# Patient Record
Sex: Male | Born: 1995 | Race: Black or African American | Hispanic: No | Marital: Single | Smoking: Never smoker
Health system: Southern US, Community
[De-identification: ages and names within clinical notes are randomized; demographics above are authoritative.]

---
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
## 2015-02-06 ENCOUNTER — Emergency Department
Admission: EM | Admit: 2015-02-06 | Discharge: 2015-02-06 | Disposition: A | Discharge status: Home / Self Care | Payer: PRIVATE HEALTH INSURANCE | Attending: Emergency Medicine | Admitting: Emergency Medicine

## 2015-02-06 ENCOUNTER — Encounter: Payer: Self-pay | Admitting: Emergency Medicine

## 2015-02-06 ENCOUNTER — Emergency Department: Payer: PRIVATE HEALTH INSURANCE

## 2015-02-06 DIAGNOSIS — S0990XA Unspecified injury of head, initial encounter: Secondary | ICD-10-CM | POA: Diagnosis not present

## 2015-02-06 DIAGNOSIS — Z880 Allergy status to penicillin: Secondary | ICD-10-CM | POA: Insufficient documentation

## 2015-02-06 DIAGNOSIS — I951 Orthostatic hypotension: Secondary | ICD-10-CM | POA: Diagnosis not present

## 2015-02-06 DIAGNOSIS — Y998 Other external cause status: Secondary | ICD-10-CM | POA: Diagnosis not present

## 2015-02-06 DIAGNOSIS — T1490XA Injury, unspecified, initial encounter: Secondary | ICD-10-CM

## 2015-02-06 DIAGNOSIS — W01198A Fall on same level from slipping, tripping and stumbling with subsequent striking against other object, initial encounter: Secondary | ICD-10-CM | POA: Insufficient documentation

## 2015-02-06 DIAGNOSIS — R55 Syncope and collapse: Secondary | ICD-10-CM | POA: Diagnosis present

## 2015-02-06 DIAGNOSIS — Y9289 Other specified places as the place of occurrence of the external cause: Secondary | ICD-10-CM | POA: Insufficient documentation

## 2015-02-06 DIAGNOSIS — Y9389 Activity, other specified: Secondary | ICD-10-CM | POA: Diagnosis not present

## 2015-02-06 LAB — URINE DRUG SCREEN, QUALITATIVE (ARMC ONLY)
Amphetamines, Ur Screen: NOT DETECTED
Barbiturates, Ur Screen: NOT DETECTED
Benzodiazepine, Ur Scrn: NOT DETECTED
CANNABINOID 50 NG, UR ~~LOC~~: NOT DETECTED
COCAINE METABOLITE, UR ~~LOC~~: NOT DETECTED
MDMA (ECSTASY) UR SCREEN: NOT DETECTED
Methadone Scn, Ur: NOT DETECTED
OPIATE, UR SCREEN: NOT DETECTED
PHENCYCLIDINE (PCP) UR S: NOT DETECTED
Tricyclic, Ur Screen: NOT DETECTED

## 2015-02-06 LAB — CBC WITH DIFFERENTIAL/PLATELET
Basophils Absolute: 0 10*3/uL (ref 0–0.1)
Basophils Relative: 1 %
EOS ABS: 0.1 10*3/uL (ref 0–0.7)
Eosinophils Relative: 2 %
HCT: 45.1 % (ref 40.0–52.0)
HEMOGLOBIN: 14.8 g/dL (ref 13.0–18.0)
LYMPHS ABS: 2.1 10*3/uL (ref 1.0–3.6)
LYMPHS PCT: 42 %
MCH: 28.1 pg (ref 26.0–34.0)
MCHC: 32.7 g/dL (ref 32.0–36.0)
MCV: 85.7 fL (ref 80.0–100.0)
Monocytes Absolute: 0.3 10*3/uL (ref 0.2–1.0)
Monocytes Relative: 7 %
NEUTROS PCT: 48 %
Neutro Abs: 2.4 10*3/uL (ref 1.4–6.5)
Platelets: 282 10*3/uL (ref 150–440)
RBC: 5.26 MIL/uL (ref 4.40–5.90)
RDW: 14.2 % (ref 11.5–14.5)
WBC: 5 10*3/uL (ref 3.8–10.6)

## 2015-02-06 LAB — COMPREHENSIVE METABOLIC PANEL
ALK PHOS: 83 U/L (ref 38–126)
ALT: 16 U/L — AB (ref 17–63)
AST: 17 U/L (ref 15–41)
Albumin: 4 g/dL (ref 3.5–5.0)
Anion gap: 3 — ABNORMAL LOW (ref 5–15)
BUN: 15 mg/dL (ref 6–20)
CALCIUM: 9.1 mg/dL (ref 8.9–10.3)
CO2: 28 mmol/L (ref 22–32)
CREATININE: 1.1 mg/dL (ref 0.61–1.24)
Chloride: 106 mmol/L (ref 101–111)
GFR calc non Af Amer: >60 mL/min (ref >60)
Glucose, Bld: 103 mg/dL — ABNORMAL HIGH (ref 65–99)
Potassium: 4 mmol/L (ref 3.5–5.1)
SODIUM: 137 mmol/L (ref 135–145)
Total Bilirubin: 0.5 mg/dL (ref 0.3–1.2)
Total Protein: 7.3 g/dL (ref 6.5–8.1)

## 2015-02-06 LAB — URINALYSIS COMPLETE WITH MICROSCOPIC (ARMC ONLY)
BACTERIA UA: NONE SEEN
BILIRUBIN URINE: NEGATIVE
Glucose, UA: NEGATIVE mg/dL
Hgb urine dipstick: NEGATIVE
KETONES UR: NEGATIVE mg/dL
Leukocytes, UA: NEGATIVE
Nitrite: NEGATIVE
PROTEIN: NEGATIVE mg/dL
SPECIFIC GRAVITY, URINE: 1.02 (ref 1.005–1.030)
WBC UA: NONE SEEN WBC/hpf (ref 0–5)
pH: 7 (ref 5.0–8.0)

## 2015-02-06 LAB — TROPONIN I

## 2015-02-06 LAB — ETHANOL: Alcohol, Ethyl (B): 14 mg/dL — ABNORMAL HIGH (ref <5)

## 2015-02-06 MED ORDER — SODIUM CHLORIDE 0.9 % IV BOLUS (SEPSIS)
1000.0000 mL | Freq: Once | INTRAVENOUS | Status: AC
  Administered 2015-02-06: 1000 mL via INTRAVENOUS

## 2015-02-06 NOTE — ED Provider Notes (Signed)
[facility] Emergency Department Provider Note  Time seen: 7:17 AM  I have reviewed the triage vital signs and the nursing notes.   HISTORY  Chief Complaint Fall and Head Injury    HPI Bethann Gooaequan U Jean is a 19 y.o. male with no past medical history who presents the emergency department after a syncopal episode. According to the patient he was lying on the couch, when he abruptly stood up to go to the bathroom. Said he felt lightheaded, so he stopped for a second and then continued walking, and then woke up on the floor. Patient states he remembers hitting his head, and he chipped one of his teeth. Upon arrival to the emergency department the patient was tearful, stating that he did not feel normal. Patient denies any recent black or bloody stool, nausea, vomiting, diarrhea, fever. States he's not been drinking as much or eating right due to college exams. States he has passed out once previously. Denies any chest pain or shortness of breath now or at any time.     History reviewed. No pertinent past medical history.  There are no active problems to display for this patient.   History reviewed. No pertinent past surgical history.  No current outpatient prescriptions on file.  Allergies Penicillins  No family history on file.  Social History Social History  Substance Use Topics  . Smoking status: Never Smoker   . Smokeless tobacco: None  . Alcohol Use: No    Review of Systems Constitutional: Negative for fever. Cardiovascular: Negative for chest pain. Respiratory: Negative for shortness of breath. Gastrointestinal: Negative for abdominal pain, vomiting and diarrhea. Musculoskeletal: Negative for back pain. Negative neck pain. Neurological: Negative for headaches, focal weakness or numbness. 10-point ROS otherwise negative.  ____________________________________________   PHYSICAL EXAM:  VITAL SIGNS: ED Triage Vitals  Enc Vitals Group   BP 02/06/15 0323 136/63 mmHg     Pulse Rate 02/06/15 0323 62     Resp 02/06/15 0323 18     Temp 02/06/15 0323 98 F (36.7 C)     Temp Source 02/06/15 0323 Oral     SpO2 02/06/15 0323 97 %     Weight 02/06/15 0323 184 lb (83.462 kg)     Height 02/06/15 0323 6\' 3"  (1.905 m)     Head Cir --      Peak Flow --      Pain Score 02/06/15 0322 8     Pain Loc --      Pain Edu? --      Excl. in GC? --     Constitutional: Alert and oriented. Well appearing and in no distress. Eyes: Normal exam ENT   Head: Normocephalic and atraumatic.   Mouth/Throat: Mucous membranes are moist. Cardiovascular: Normal rate, regular rhythm. No murmur Respiratory: Normal respiratory effort without tachypnea nor retractions. Breath sounds are clear and equal bilaterally. No wheezes/rales/rhonchi. Gastrointestinal: Soft and nontender. No distention.  Musculoskeletal: Nontender with normal range of motion in all extremities.  Neurologic:  Normal speech and language. No gross focal neurologic deficits. Equal grip strengths. No pronator drift.  Skin:  Skin is warm, dry and intact.  Psychiatric: Mood and affect are normal. Speech and behavior are normal.  ____________________________________________    EKG  EKG reviewed and interpreted by myself shows normal sinus rhythm at 54 bpm, narrow QRS, normal axis, normal intervals, changes most consistent with early repolarization. No concerning ST changes noted.  ____________________________________________    RADIOLOGY  Knee x-ray within normal  limits CT head negative  ____________________________________________   INITIAL IMPRESSION / ASSESSMENT AND PLAN / ED COURSE  Pertinent labs & imaging results that were available during my care of the patient were reviewed by me and considered in my medical decision making (see chart for details).  Patient presents the emergency department after syncopal episode. His description is most consistent with  orthostatic hypotension. States a syncopal episode happened within 1 minute of standing. Patient appears well in the emergency department. Normal labs, reassuring EKG, negative imaging. Patient states he feels normal at this time. I discussed with the patient as this is his second syncopal episode, cardiology follow-up for possible Holter monitoring or tilt table testing. Patient is agreeable to plan. Discussed with patient and his mother lots of rest for the next 2-3 days, increase fluids, patient and family agreeable. Discussed my normal syncope return precautions.  ____________________________________________   FINAL CLINICAL IMPRESSION(S) / ED DIAGNOSES  Syncope   Minna Antis, MD 02/06/15 [phone]

## 2015-02-06 NOTE — Discharge Instructions (Signed)
Syncope °Syncope means a person passes out (faints). The person usually wakes up in less than 5 minutes. It is important to seek medical care for syncope. °HOME CARE °· Have someone stay with you until you feel normal. °· Do not drive, use machines, or play sports until your doctor says it is okay. °· Keep all doctor visits as told. °· Lie down when you feel like you might pass out. Take deep breaths. Wait until you feel normal before standing up. °· Drink enough fluids to keep your pee (urine) clear or pale yellow. °· If you take blood pressure or heart medicine, get up slowly. Take several minutes to sit and then stand. °GET HELP RIGHT AWAY IF:  °· You have a severe headache. °· You have pain in the chest, belly (abdomen), or back. °· You are bleeding from the mouth or butt (rectum). °· You have black or tarry poop (stool). °· You have an irregular or very fast heartbeat. °· You have pain with breathing. °· You keep passing out, or you have shaking (seizures) when you pass out. °· You pass out when sitting or lying down. °· You feel confused. °· You have trouble walking. °· You have severe weakness. °· You have vision problems. °If you fainted, call for help (911 in [place] Do not drive yourself to the hospital. °  °This information is not intended to replace advice given to you by your health care provider. Make sure you discuss any questions you have with your health care provider. °  °Document Released: 07/31/2007 Document Revised: 06/28/2014 Document Reviewed: 04/12/2011 °Elsevier Interactive Patient Education ©2016 [employer] ° °

## 2015-02-06 NOTE — ED Notes (Addendum)
Patient ambulatory to triage with steady gait, without difficulty or distress noted; pt reports getting up to BR, and fell; st unsure of LOC or cause for fall but "couldn't see"; st hit right side of head on wall; c/o HA to right side head; tenderness; pt becomes tearful during triage; st doesn't know why he is crying but "doesn't feel right"; mother st this is 2nd incident of pt crying since fall; pt denies any recent illness; spoke with Dr Zenda AlpersWebster regarding pt's presenting cc and symptoms; order obtained

## 2017-02-18 IMAGING — CR DG KNEE COMPLETE 4+V*R*
4 series · 4 of 4 positions shown · non-contrast
Comparison: None.

CLINICAL DATA: Status post fall, with pain at the right patella.
Initial encounter.

EXAM:
RIGHT KNEE - COMPLETE 4+ VIEW

[knee ap]
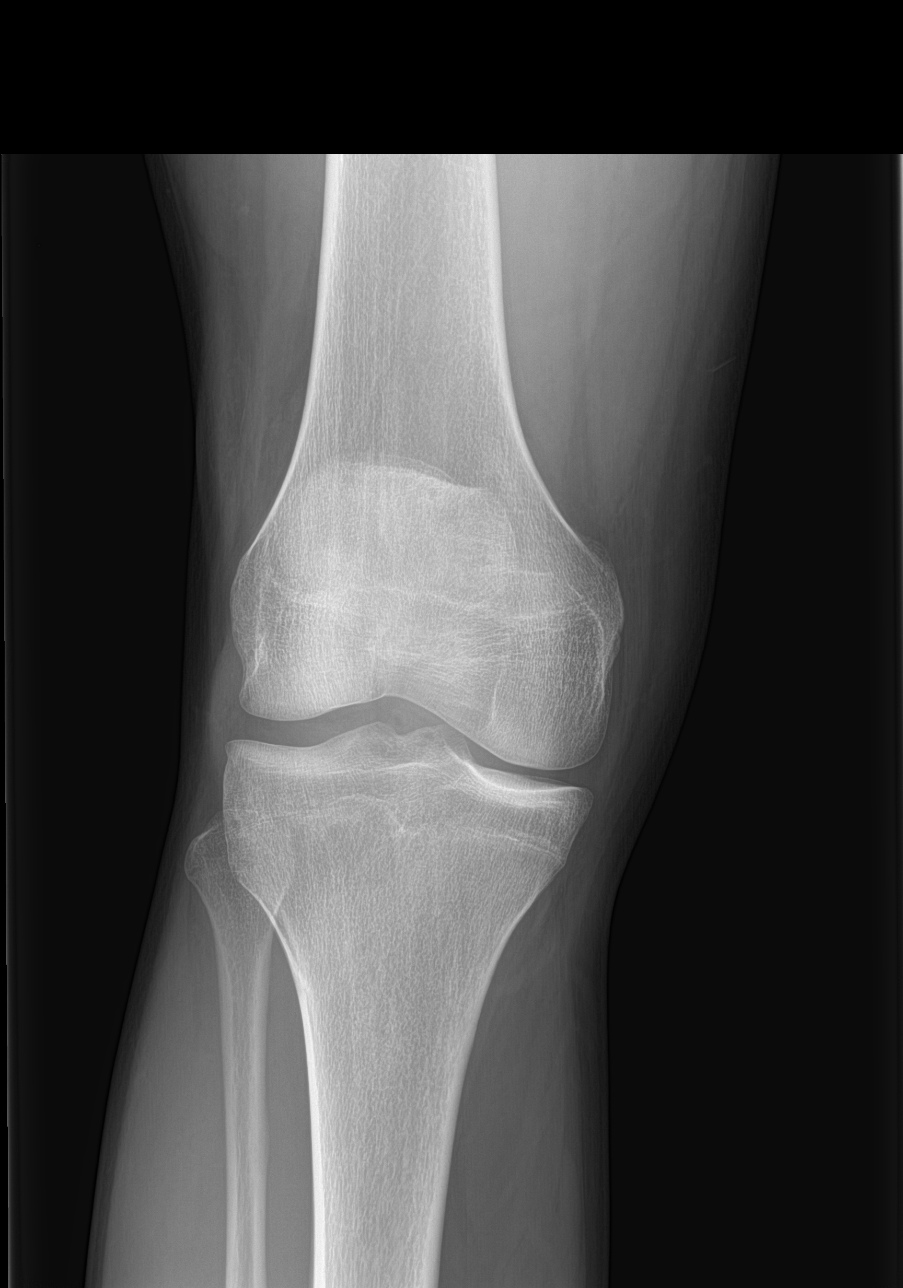

[knee obl (1 of 2)]
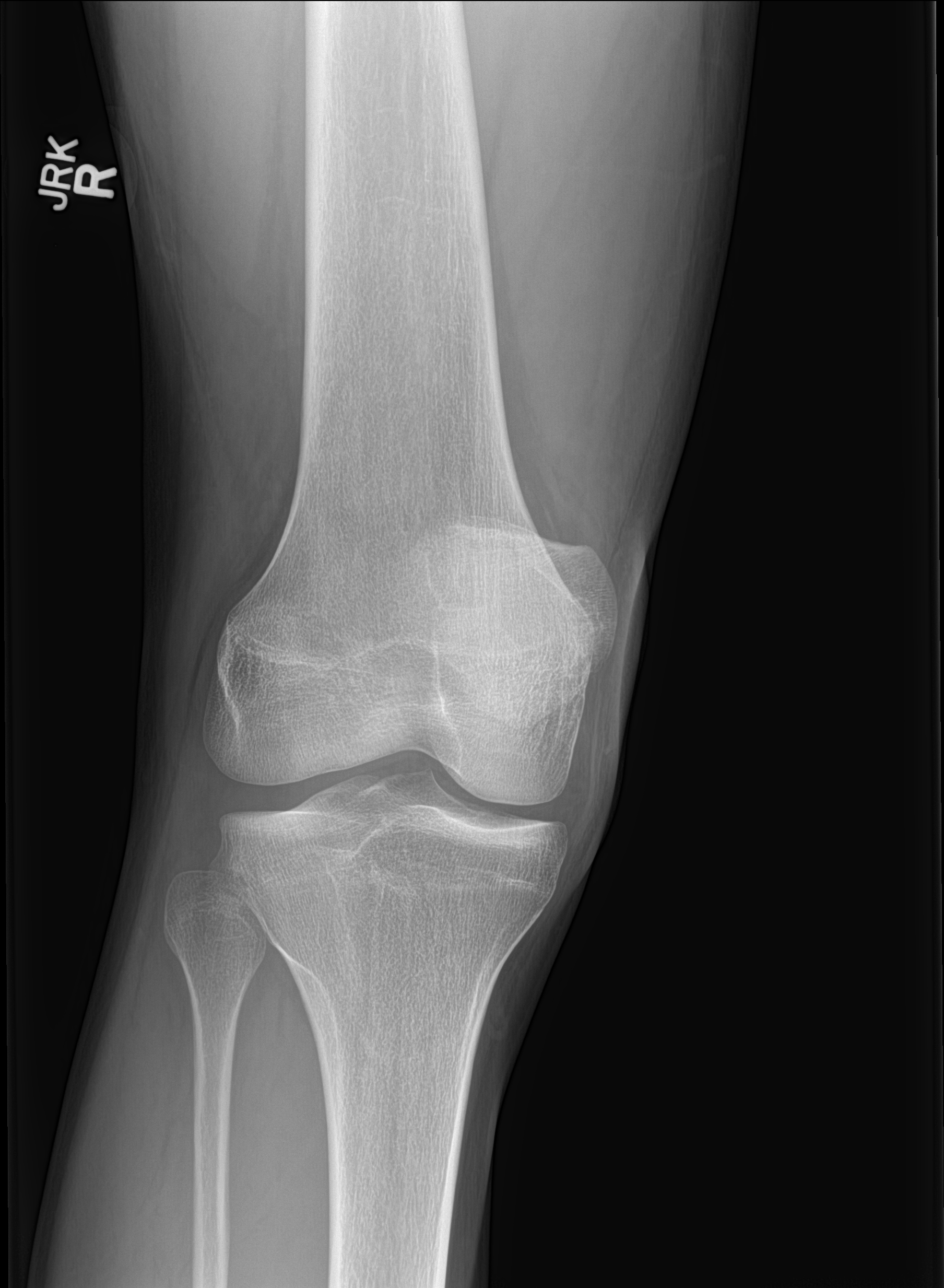

[knee obl (2 of 2)]
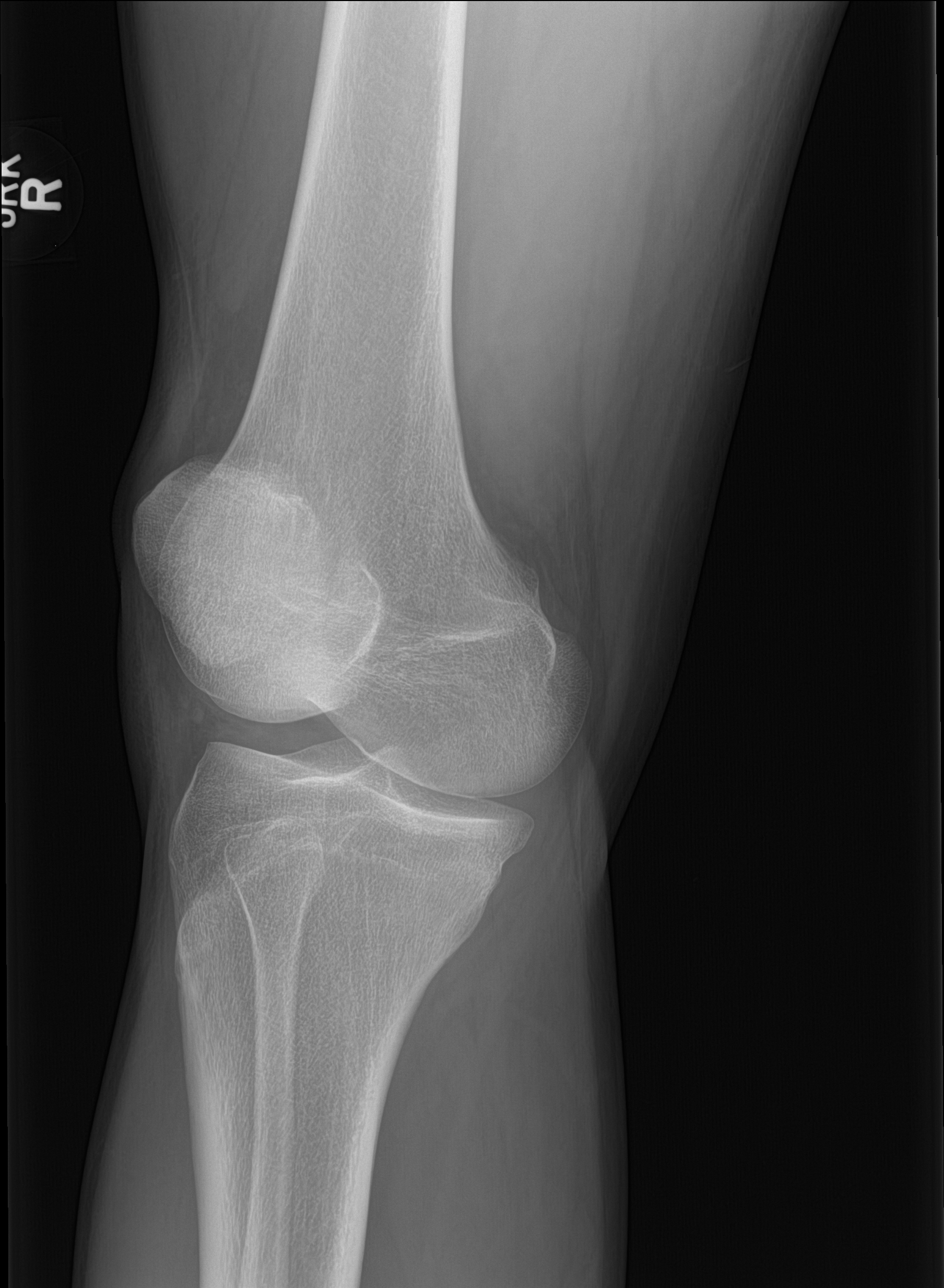

[knee lat]
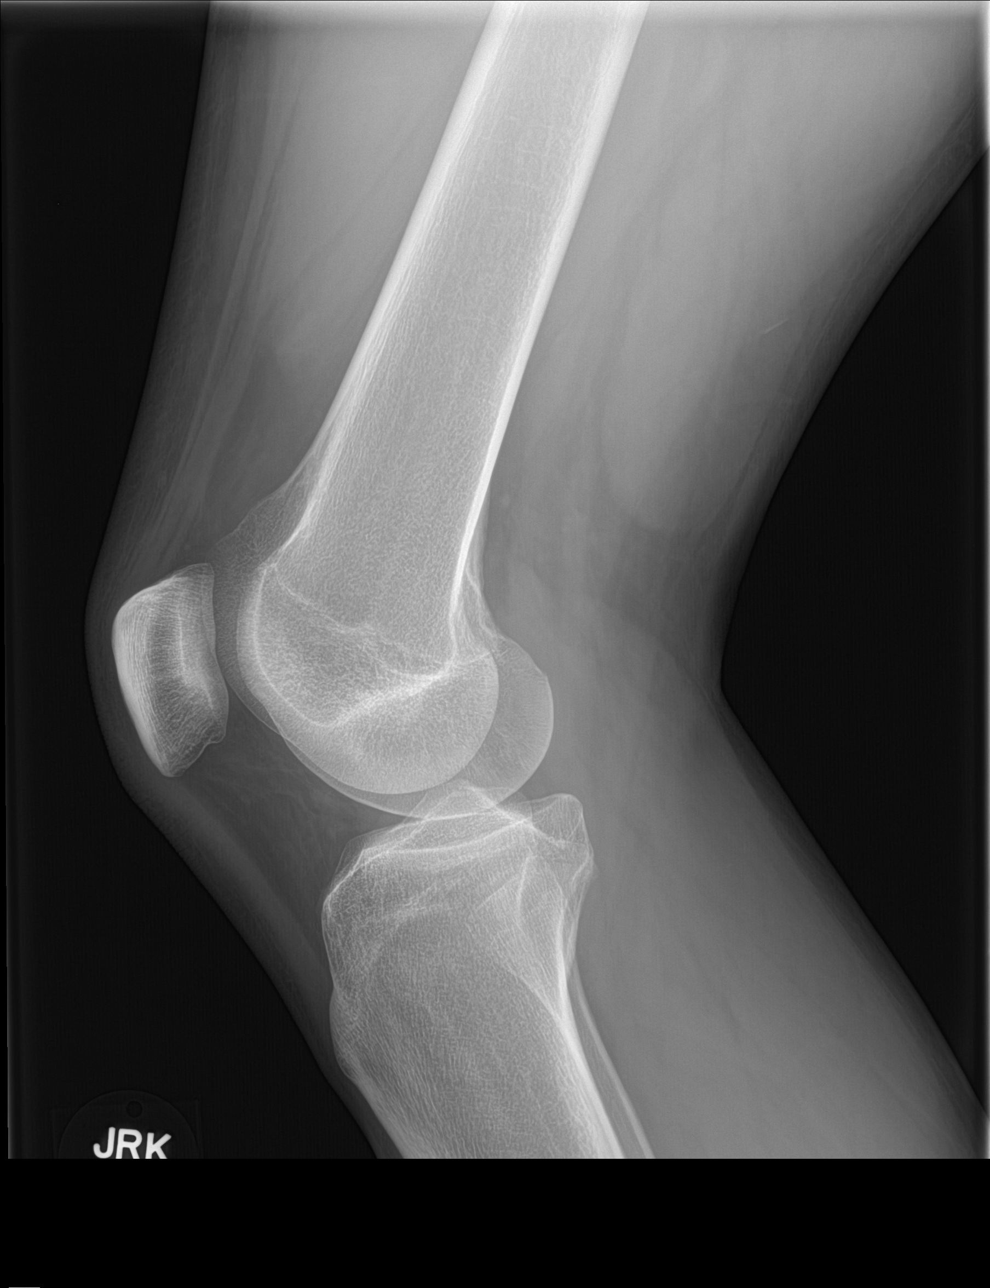

[4 of 4 positions shown; findings below may reference images not displayed]

FINDINGS: There is no evidence of fracture or dislocation. The joint spaces
are preserved. No significant degenerative change is seen; the
patellofemoral joint is grossly unremarkable in appearance.

No significant joint effusion is seen. The visualized soft tissues
are normal in appearance.
IMPRESSION: No evidence of fracture or dislocation.

## 2020-08-28 ENCOUNTER — Other Ambulatory Visit: Payer: Self-pay

## 2020-08-28 ENCOUNTER — Emergency Department
Admission: EM | Admit: 2020-08-28 | Discharge: 2020-08-28 | Disposition: A | Discharge status: Home / Self Care | Payer: PRIVATE HEALTH INSURANCE | Attending: Emergency Medicine | Admitting: Emergency Medicine

## 2020-08-28 DIAGNOSIS — L299 Pruritus, unspecified: Secondary | ICD-10-CM | POA: Diagnosis present

## 2020-08-28 DIAGNOSIS — Z5321 Procedure and treatment not carried out due to patient leaving prior to being seen by health care provider: Secondary | ICD-10-CM | POA: Insufficient documentation

## 2020-08-28 MED ORDER — DIPHENHYDRAMINE HCL 25 MG PO CAPS
50.0000 mg | ORAL_CAPSULE | Freq: Once | ORAL | Status: AC
  Administered 2020-08-28: 50 mg via ORAL
  Filled 2020-08-28: qty 2

## 2020-08-28 NOTE — ED Triage Notes (Signed)
Pt states is having hand  and body itching for two days. No rash noted. Pt states took benadryl without relief.

## 2020-08-28 NOTE — ED Notes (Signed)
Secure chat sent to Dr. Don Perking for benadryl order.

## 2020-08-28 NOTE — ED Notes (Signed)
Pt requesting benadryl states he took some around 4pm yesterday, states it helped him fall asleep and helped some with his rash and itching.
